# Patient Record
Sex: Male | Born: 1990 | Race: White | Hispanic: No | Marital: Single | Smoking: Never smoker
Health system: Southern US, Community
[De-identification: ages and names within clinical notes are randomized; demographics above are authoritative.]

## PROBLEM LIST (undated history)

## (undated) HISTORY — PX: ABDOMINAL SURGERY: SHX537

---
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
## 2015-01-30 ENCOUNTER — Encounter: Payer: Self-pay | Admitting: Emergency Medicine

## 2015-01-30 ENCOUNTER — Ambulatory Visit: Admission: EM | Admit: 2015-01-30 | Discharge: 2015-01-30 | Discharge status: Absent without leave | Payer: Self-pay

## 2015-01-30 ENCOUNTER — Emergency Department
Admission: EM | Admit: 2015-01-30 | Discharge: 2015-01-30 | Disposition: A | Discharge status: Home / Self Care | Payer: Managed Care, Other (non HMO) | Attending: Emergency Medicine | Admitting: Emergency Medicine

## 2015-01-30 ENCOUNTER — Ambulatory Visit
Admission: EM | Admit: 2015-01-30 | Discharge: 2015-01-30 | Disposition: A | Discharge status: Home / Self Care | Payer: Managed Care, Other (non HMO) | Attending: Family Medicine | Admitting: Family Medicine

## 2015-01-30 ENCOUNTER — Emergency Department: Payer: Managed Care, Other (non HMO)

## 2015-01-30 DIAGNOSIS — R519 Headache, unspecified: Secondary | ICD-10-CM

## 2015-01-30 DIAGNOSIS — R0789 Other chest pain: Secondary | ICD-10-CM | POA: Diagnosis not present

## 2015-01-30 DIAGNOSIS — Z79899 Other long term (current) drug therapy: Secondary | ICD-10-CM | POA: Insufficient documentation

## 2015-01-30 DIAGNOSIS — R0602 Shortness of breath: Secondary | ICD-10-CM | POA: Insufficient documentation

## 2015-01-30 DIAGNOSIS — R51 Headache: Secondary | ICD-10-CM | POA: Diagnosis present

## 2015-01-30 DIAGNOSIS — R42 Dizziness and giddiness: Secondary | ICD-10-CM | POA: Insufficient documentation

## 2015-01-30 DIAGNOSIS — R002 Palpitations: Secondary | ICD-10-CM | POA: Insufficient documentation

## 2015-01-30 DIAGNOSIS — J069 Acute upper respiratory infection, unspecified: Secondary | ICD-10-CM | POA: Diagnosis not present

## 2015-01-30 LAB — COMPREHENSIVE METABOLIC PANEL
ALBUMIN: 4.8 g/dL (ref 3.5–5.0)
ALK PHOS: 57 U/L (ref 38–126)
ALK PHOS: 58 U/L (ref 38–126)
ALT: 43 U/L (ref 17–63)
ALT: 44 U/L (ref 17–63)
ANION GAP: 12 (ref 5–15)
ANION GAP: 10 (ref 5–15)
AST: 31 U/L (ref 15–41)
AST: 30 U/L (ref 15–41)
Albumin: 5 g/dL (ref 3.5–5.0)
BILIRUBIN TOTAL: 1.4 mg/dL — AB (ref 0.3–1.2)
BUN: 13 mg/dL (ref 6–20)
BUN: 13 mg/dL (ref 6–20)
CALCIUM: 9.5 mg/dL (ref 8.9–10.3)
CALCIUM: 9.3 mg/dL (ref 8.9–10.3)
CHLORIDE: 101 mmol/L (ref 101–111)
CO2: 27 mmol/L (ref 22–32)
CO2: 24 mmol/L (ref 22–32)
CREATININE: 0.96 mg/dL (ref 0.61–1.24)
CREATININE: 0.87 mg/dL (ref 0.61–1.24)
Chloride: 104 mmol/L (ref 101–111)
GFR calc non Af Amer: >60 mL/min (ref >60)
GLUCOSE: 94 mg/dL (ref 65–99)
Glucose, Bld: 101 mg/dL — ABNORMAL HIGH (ref 65–99)
Potassium: 4.3 mmol/L (ref 3.5–5.1)
Potassium: 4.2 mmol/L (ref 3.5–5.1)
SODIUM: 140 mmol/L (ref 135–145)
Sodium: 138 mmol/L (ref 135–145)
TOTAL PROTEIN: 9.1 g/dL — AB (ref 6.5–8.1)
Total Bilirubin: 1.2 mg/dL (ref 0.3–1.2)
Total Protein: 8.8 g/dL — ABNORMAL HIGH (ref 6.5–8.1)

## 2015-01-30 LAB — CBC WITH DIFFERENTIAL/PLATELET
BASOS PCT: 0 %
Basophils Absolute: 0.1 10*3/uL (ref 0–0.1)
Basophils Absolute: 0 10*3/uL (ref 0–0.1)
Basophils Relative: 0 %
EOS ABS: 0.1 10*3/uL (ref 0–0.7)
EOS PCT: 0 %
Eosinophils Absolute: 0 10*3/uL (ref 0–0.7)
Eosinophils Relative: 0 %
HCT: 44.7 % (ref 40.0–52.0)
HEMATOCRIT: 45.9 % (ref 40.0–52.0)
HEMOGLOBIN: 15.3 g/dL (ref 13.0–18.0)
HEMOGLOBIN: 15.3 g/dL (ref 13.0–18.0)
Lymphocytes Relative: 5 %
Lymphocytes Relative: 6 %
Lymphs Abs: 0.9 10*3/uL — ABNORMAL LOW (ref 1.0–3.6)
Lymphs Abs: 0.9 10*3/uL — ABNORMAL LOW (ref 1.0–3.6)
MCH: 29.7 pg (ref 26.0–34.0)
MCH: 29.3 pg (ref 26.0–34.0)
MCHC: 34.1 g/dL (ref 32.0–36.0)
MCHC: 33.4 g/dL (ref 32.0–36.0)
MCV: 86.9 fL (ref 80.0–100.0)
MCV: 87.7 fL (ref 80.0–100.0)
MONOS PCT: 5 %
Monocytes Absolute: 0.9 10*3/uL (ref 0.2–1.0)
Monocytes Absolute: 0.8 10*3/uL (ref 0.2–1.0)
Monocytes Relative: 5 %
NEUTROS ABS: 14 10*3/uL — AB (ref 1.4–6.5)
NEUTROS PCT: 90 %
NEUTROS PCT: 89 %
Neutro Abs: 16 10*3/uL — ABNORMAL HIGH (ref 1.4–6.5)
PLATELETS: 227 10*3/uL (ref 150–440)
Platelets: 237 10*3/uL (ref 150–440)
RBC: 5.14 MIL/uL (ref 4.40–5.90)
RBC: 5.24 MIL/uL (ref 4.40–5.90)
RDW: 13.3 % (ref 11.5–14.5)
RDW: 13.3 % (ref 11.5–14.5)
WBC: 17.9 10*3/uL — AB (ref 3.8–10.6)
WBC: 15.7 10*3/uL — AB (ref 3.8–10.6)

## 2015-01-30 LAB — CKMB (ARMC ONLY): CK, MB: 1.3 ng/mL (ref 0.5–5.0)

## 2015-01-30 LAB — TROPONIN I

## 2015-01-30 LAB — FIBRIN DERIVATIVES D-DIMER (ARMC ONLY): FIBRIN DERIVATIVES D-DIMER (ARMC): 857 — AB (ref 0–499)

## 2015-01-30 LAB — CK: Total CK: 111 U/L (ref 49–397)

## 2015-01-30 MED ORDER — LEVOFLOXACIN 750 MG PO TABS: 750.0000 mg | ORAL_TABLET | Freq: Every day | ORAL | Status: AC

## 2015-01-30 MED ORDER — IOHEXOL 350 MG/ML SOLN
100.0000 mL | Freq: Once | INTRAVENOUS | Status: AC | PRN
  Administered 2015-01-30: 100 mL via INTRAVENOUS
  Filled 2015-01-30: qty 100

## 2015-01-30 MED ORDER — SUMATRIPTAN SUCCINATE 6 MG/0.5ML ~~LOC~~ SOLN
6.0000 mg | Freq: Once | SUBCUTANEOUS | Status: AC
  Administered 2015-01-30: 6 mg via SUBCUTANEOUS

## 2015-01-30 MED ORDER — HYDROCOD POLST-CPM POLST ER 10-8 MG/5ML PO SUER: 5.0000 mL | Freq: Two times a day (BID) | ORAL | Status: AC

## 2015-01-30 MED ORDER — KETOROLAC TROMETHAMINE 60 MG/2ML IM SOLN
60.0000 mg | Freq: Once | INTRAMUSCULAR | Status: AC
  Administered 2015-01-30: 60 mg via INTRAMUSCULAR

## 2015-01-30 MED ORDER — ONDANSETRON 8 MG PO TBDP
8.0000 mg | ORAL_TABLET | Freq: Once | ORAL | Status: AC
  Administered 2015-01-30: 8 mg via ORAL

## 2015-01-30 NOTE — ED Notes (Signed)
Patient states he has been up since 3am with a pounding headache, also feels like he cant take a deep breath and his pulse is racing

## 2015-01-30 NOTE — ED Notes (Signed)
Patient presents to the ED with headache, dizziness, and high heart rate from [facility].  Patient had labs drawn and [facility]KG done at [facility].  Patient also reports diaphoresis.  Patient reports waking up three diffierent days in the past week with a severe headache.  Patient reports a "pounding" headache that is worse with bright lights and movement.  Patient states headache has improved somewhat since going to [facility].  Patient states his watch recorded a pulse this am of 140 at 9am.  Patient denies chest pain at this time.  Patient reports difficulty taking a deep breath this am.  Patient states now he can breathe easier.

## 2015-01-30 NOTE — Discharge Instructions (Signed)
Upper Respiratory Infection, Adult Most upper respiratory infections (URIs) are caused by a virus. A URI affects the nose, throat, and upper air passages. The most common type of URI is often called "the common cold." HOME CARE   Take medicines only as told by your doctor.  Gargle warm saltwater or take cough drops to comfort your throat as told by your doctor.  Use a warm mist humidifier or inhale steam from a shower to increase air moisture. This may make it easier to breathe.  Drink enough fluid to keep your pee (urine) clear or pale yellow.  Eat soups and other clear broths.  Have a healthy diet.  Rest as needed.  Go back to work when your fever is gone or your doctor says it is okay.  You may need to stay home longer to avoid giving your URI to others.  You can also wear a face mask and wash your hands often to prevent spread of the virus.  Use your inhaler more if you have asthma.  Do not use any tobacco products, including cigarettes, chewing tobacco, or electronic cigarettes. If you need help quitting, ask your doctor. GET HELP IF:  You are getting worse, not better.  Your symptoms are not helped by medicine.  You have chills.  You are getting more short of breath.  You have brown or red mucus.  You have yellow or brown discharge from your nose.  You have pain in your face, especially when you bend forward.  You have a fever.  You have puffy (swollen) neck glands.  You have pain while swallowing.  You have white areas in the back of your throat. GET HELP RIGHT AWAY IF:   You have very bad or constant:  Headache.  Ear pain.  Pain in your forehead, behind your eyes, and over your cheekbones (sinus pain).  Chest pain.  You have long-lasting (chronic) lung disease and any of the following:  Wheezing.  Long-lasting cough.  Coughing up blood.  A change in your usual mucus.  You have a stiff neck.  You have changes in  your:  Vision.  Hearing.  Thinking.  Mood. MAKE SURE YOU:   Understand these instructions.  Will watch your condition.  Will get help right away if you are not doing well or get worse.   This information is not intended to replace advice given to you by your health care provider. Make sure you discuss any questions you have with your health care provider.   Document Released: 08/12/2007 Document Revised: 07/10/2014 Document Reviewed: 05/31/2013 Elsevier Interactive Patient Education 2016 Else[employer] Inc.  General Headache Without Cause A headache is pain or discomfort felt around the head or neck area. The specific cause of a headache may not be found. There are many causes and types of headaches. A few common ones are:  Tension headaches.  Migraine headaches.  Cluster headaches.  Chronic daily headaches. HOME CARE INSTRUCTIONS  Watch your condition for any changes. Take these steps to help with your condition: Managing Pain  Take over-the-counter and prescription medicines only as told by your health care provider.  Lie down in a dark, quiet room when you have a headache.  If directed, apply ice to the head and neck area:  Put ice in a plastic bag.  Place a towel between your skin and the bag.  Leave the ice on for 20 minutes, 2-3 times per day.  Use a heating pad or hot shower to apply heat  to the head and neck area as told by your health care provider.  Keep lights dim if bright lights bother you or make your headaches worse. Eating and Drinking  Eat meals on a regular schedule.  Limit alcohol use.  Decrease the amount of caffeine you drink, or stop drinking caffeine. General Instructions  Keep all follow-up visits as told by your health care provider. This is important.  Keep a headache journal to help find out what may trigger your headaches. For example, write down:  What you eat and drink.  How much sleep you get.  Any change to your diet or  medicines.  Try massage or other relaxation techniques.  Limit stress.  Sit up straight, and do not tense your muscles.  Do not use tobacco products, including cigarettes, chewing tobacco, or e-cigarettes. If you need help quitting, ask your health care provider.  Exercise regularly as told by your health care provider.  Sleep on a regular schedule. Get 7-9 hours of sleep, or the amount recommended by your health care provider. SEEK MEDICAL CARE IF:   Your symptoms are not helped by medicine.  You have a headache that is different from the usual headache.  You have nausea or you vomit.  You have a fever. SEEK IMMEDIATE MEDICAL CARE IF:   Your headache becomes severe.  You have repeated vomiting.  You have a stiff neck.  You have a loss of vision.  You have problems with speech.  You have pain in the eye or ear.  You have muscular weakness or loss of muscle control.  You lose your balance or have trouble walking.  You feel faint or pass out.  You have confusion.   This information is not intended to replace advice given to you by your health care provider. Make sure you discuss any questions you have with your health care provider.   Document Released: 02/23/2005 Document Revised: 11/14/2014 Document Reviewed: 06/18/2014 Elsevier Interactive Patient Education [employer]

## 2015-01-30 NOTE — Discharge Instructions (Signed)
Chest Wall Pain °Chest wall pain is pain in or around the bones and muscles of your chest. Sometimes, an injury causes this pain. Sometimes, the cause may not be known. This pain may take several weeks or longer to get better. °HOME CARE °Pay attention to any changes in your symptoms. Take these actions to help with your pain: °· Rest as told by your doctor. °· Avoid activities that cause pain. Try not to use your chest, belly (abdominal), or side muscles to lift heavy things. °· If directed, apply ice to the painful area: °¨ Put ice in a plastic bag. °¨ Place a towel between your skin and the bag. °¨ Leave the ice on for 20 minutes, 2-3 times per day. °· Take over-the-counter and prescription medicines only as told by your doctor. °· Do not use tobacco products, including cigarettes, chewing tobacco, and e-cigarettes. If you need help quitting, ask your doctor. °· Keep all follow-up visits as told by your doctor. This is important. °GET HELP IF: °· You have a fever. °· Your chest pain gets worse. °· You have new symptoms. °GET HELP RIGHT AWAY IF: °· You feel sick to your stomach (nauseous) or you throw up (vomit). °· You feel sweaty or light-headed. °· You have a cough with phlegm (sputum) or you cough up blood. °· You are short of breath. °  °This information is not intended to replace advice given to you by your health care provider. Make sure you discuss any questions you have with your health care provider. °  °Document Released: 08/12/2007 Document Revised: 11/14/2014 Document Reviewed: 05/21/2014 °Elsevier Interactive Patient Education ©2016 Elsevier Inc. ° °

## 2015-01-30 NOTE — ED Notes (Signed)
Dr. Thurmond ButtsWade spoke with Jaime SoursGreg RN at [facility] to inform him patient going POV there. Labs drawn and all ordered medications given. States 6/10 headache still, no relief from Toradol or Imitrex

## 2015-01-30 NOTE — ED Provider Notes (Signed)
[facility] Emergency Department Provider Note     Time seen: ----------------------------------------- 2:08 PM on 01/30/2015 -----------------------------------------    I have reviewed the triage vital signs and the nursing notes.   HISTORY  Chief Complaint Headache and Palpitations    HPI Jaime Lewis is a 24 y.o. male who presents ER with severe headache on and off last week. Patient is typically not had a history of headaches, presented to an urgent care today with headache dizziness and rapid heartbeat. Patient had labs drawn EKG done at [facility]Patient reports a pounding headache gets the top of his head was worse with bright lights and movement. Patient received Toradol shot at [facility] urgent care and his symptoms have currently resolved   No past medical history on file.  There are no active problems to display for this patient.   Past Surgical History  Procedure Laterality Date  . Abdominal surgery    . 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 Smoking status: Never Smoker   . Smokeless tobacco: None  . Alcohol Use: Yes     Comment: occasionally    Review of Systems Constitutional: Negative for fever. Eyes: Negative for visual changes. ENT: Negative for sore throat. Cardiovascular: Negative for chest pain. Respiratory: Negative for shortness of breath. Gastrointestinal: Negative for abdominal pain, vomiting and diarrhea. Genitourinary: Negative for dysuria. Musculoskeletal: Negative for back pain. Skin: Negative for rash. Neurological: Positive for headache and dizziness  10-point ROS otherwise negative.  ____________________________________________   PHYSICAL EXAM:  VITAL SIGNS: ED Triage Vitals  Enc Vitals Group     BP 01/30/15 1252 136/79 mmHg     Pulse Rate 01/30/15 1252 103     Resp  01/30/15 1252 20     Temp 01/30/15 1252 98.5 F (36.9 C)     Temp Source 01/30/15 1252 Oral     SpO2 01/30/15 1252 96 %     Weight 01/30/15 1252 350 lb (158.759 kg)     Height 01/30/15 1252  (1.854 m)     Head Cir --      Peak Flow --      Pain Score 01/30/15 1253 4     Pain Loc --      Pain Edu? --      Excl. in GC? --     Constitutional: Alert and oriented. Well appearing and in no distress. Eyes: Conjunctivae are normal. PERRL. Normal extraocular movements. ENT   Head: Normocephalic and atraumatic.   Nose: No congestion/rhinnorhea.   Mouth/Throat: Mucous membranes are moist.   Neck: No stridor. Cardiovascular: Normal rate, regular rhythm. Normal and symmetric distal pulses are present in all extremities. No murmurs, rubs, or gallops. Respiratory: Normal respiratory effort without tachypnea nor retractions. Breath sounds are clear and equal bilaterally. No wheezes/rales/rhonchi. Gastrointestinal: Soft and nontender. No distention. No abdominal bruits.  Musculoskeletal: Nontender with normal range of motion in all extremities. No joint effusions.  No lower extremity tenderness nor edema. Neurologic:  Normal speech and language. No gross focal neurologic deficits are appreciated. Speech is normal. No gait instability. Skin:  Skin is warm, dry and intact. No rash noted. Psychiatric: Mood and affect are normal. Speech and behavior are normal. Patient exhibits appropriate insight and judgment. ____________________________________________  ED COURSE:  Pertinent labs & imaging results that were available during my care of the  patient were reviewed by me and considered in my medical decision making (see chart for details). Patient is in no acute distress, unclear etiology. I will check basic labs and obtain his CT imaging ____________________________________________    LABS (pertinent positives/negatives)  Labs Reviewed  CBC WITH DIFFERENTIAL/PLATELET - Abnormal;  Notable for the following:    WBC 15.7 (*)    Neutro Abs 14.0 (*)    Lymphs Abs 0.9 (*)    All other components within normal limits  COMPREHENSIVE METABOLIC PANEL - Abnormal; Notable for the following:    Total Protein 9.1 (*)    Total Bilirubin 1.4 (*)    All other components within normal limits    RADIOLOGY Images were viewed by me  CT head Is unremarkable Ct chest  IMPRESSION: 1. No evidence of acute pulmonary embolism. 2. Patchy ground-glass opacities in both lungs. These are nonspecific and may be secondary to edema, early inflammation or interstitial lung disease. Radiographic follow up recommended. 3. Mild hepatic steatosis. ____________________________________________  FINAL ASSESSMENT AND PLAN  Headache, palpitations  Plan: Patient with labs and imaging as dictated above. Patient with elevated d-dimer sent by urgent care, CT angiogram will be performed.   Patient could be developing a respiratory infection. I cannot explain all the symptoms or lab findings. He'll be given Levaquin and Tussionex which would cover any cough or congestion as well as headache. He is encouraged to follow-up with his doctor in 2 days for recheck.   Emily FilbertWilliams, Malaquias Lenker E, MD   Emily FilbertJonathan E Jahne Krukowski, MD 01/30/15 [id number]

## 2015-01-30 NOTE — ED Provider Notes (Signed)
CSN: [id number]     Arrival date & time 01/30/15  1001 History   First MD Initiated Contact with Patient 01/30/15 1113    Nurses notes were reviewed. Chief Complaint  Patient presents with  . Headache     Patient is an obese white male with history of having severe headaches today. He states that last week he had real bad headache lasted for a short time he also became somewhat diaphoretic and short of breath as well. Monday he had a recurrence but did not last long and then occluded. Today he was at work starts sweating in a cold environment became short of breath and chest discomfort chest pain and headache. He does not have a history of migraines. Reports no long travels in the last few weeks. No history of blood clots. Other than also obesity which she states she's lost about 30 pounds in the last few months by changes eating habits not by excising he denies any other medical issues. States is not diaphoretic anymore but the headache has continued to feel like a pounding sensation. He's never had a CT of the head either.  (Consider location/radiation/quality/duration/timing/severity/associated sxs/prior Treatment) Patient is a 24 y.o. male presenting with headaches. The history is provided by the patient. A language interpreter was used.  Headache Pain location:  Generalized Quality:  Stabbing Radiates to:  Does not radiate Onset quality:  Sudden Timing:  Unable to specify Progression:  Unchanged Context: activity and bright light   Relieved by:  Nothing Worsened by:  Activity and light Ineffective treatments:  None tried Associated symptoms: no facial pain, no hearing loss and no numbness   Risk factors: sedentary lifestyle   Risk factors: no anger, no family hx of SAH and does not have insomnia     History reviewed. No pertinent past medical history. Past Surgical History  Procedure Laterality Date  . Abdominal surgery     History reviewed. No pertinent family history. Social  History  Substance Use Topics  . Smoking status: Never Smoker   . Smokeless tobacco: None  . Alcohol Use: Yes    Review of Systems  Constitutional: Positive for diaphoresis.  HENT: Negative for hearing loss.   Respiratory: Positive for chest tightness and shortness of breath. Negative for wheezing.   Neurological: Positive for light-headedness and headaches. Negative for speech difficulty and numbness.  Psychiatric/Behavioral: The patient is nervous/anxious.   All other systems reviewed and are negative.   Allergies  Sulfa antibiotics  Home Medications   Prior to Admission medications   Not on File   Meds Ordered and Administered this Visit   Medications  ondansetron (ZOFRAN-ODT) disintegrating tablet 8 mg (8 mg Oral Given 01/30/15 1214)  SUMAtriptan (IMITREX) injection 6 mg (6 mg Subcutaneous Given 01/30/15 1203)  ketorolac (TORADOL) injection 60 mg (60 mg Intramuscular Given 01/30/15 1203)    BP 135/75 mmHg  Pulse 109  Temp(Src) 98.7 F (37.1 C) (Tympanic)  Resp 20  Ht 6\' 1"  (1.854 m)  Wt 357 lb (161.934 kg)  BMI 47.11 kg/m2  SpO2 98% No data found.   Physical Exam  Constitutional: He is oriented to person, place, and time. He appears well-developed and well-nourished. He appears distressed.  Obese white male  HENT:  Head: Normocephalic and atraumatic.  Right Ear: External ear normal.  Left Ear: External ear normal.  Eyes: Conjunctivae are normal. Pupils are equal, round, and reactive to light.  Neck: Normal range of motion. Neck supple. No tracheal deviation present.  Cardiovascular:  Normal rate, regular rhythm and normal heart sounds.   Pulmonary/Chest: Effort normal and breath sounds normal.  Musculoskeletal: Normal range of motion. He exhibits no edema.  Neurological: He is alert and oriented to person, place, and time. No cranial nerve deficit. Coordination normal.  Skin: Skin is warm and dry. No erythema.  Psychiatric: He has a normal mood and affect.   Vitals reviewed.   ED Course  Procedures (including critical care time)  Labs Review Labs Reviewed  CBC WITH DIFFERENTIAL/PLATELET - Abnormal; Notable for the following:    WBC 17.9 (*)    Neutro Abs 16.0 (*)    Lymphs Abs 0.9 (*)    All other components within normal limits  BASIC METABOLIC PANEL  FIBRIN DERIVATIVES D-DIMER ([facility] ONLY)  TROPONIN I  CK  CKMB([facility] ONLY)  COMPREHENSIVE METABOLIC PANEL    Imaging Review No results found.   Visual Acuity Review  Right Eye Distance:   Left Eye Distance:   Bilateral Distance:    Right Eye Near:   Left Eye Near:    Bilateral Near:     ED ECG REPORT   Date: 01/30/2015  EKG Time: 12:29 PM  Rate: 94  Rhythm: normal sinus rhythm,  normal EKG, normal sinus rhythm, there are no previous tracings available for comparison  Axis: 45  Intervals:none  ST&T Change: none  Narrative Interpretation: sinus rhythm w/sinus arrhytmia      Results for orders placed or performed during the hospital encounter of 01/30/15  CBC with Differential  Result Value Ref Range   WBC 17.9 (H) 3.8 - 10.6 K/uL   RBC 5.14 4.40 - 5.90 MIL/uL   Hemoglobin 15.3 13.0 - 18.0 g/dL   HCT 91.4 78.2 - 95.6 %   MCV 86.9 80.0 - 100.0 fL   MCH 29.7 26.0 - 34.0 pg   MCHC 34.1 32.0 - 36.0 g/dL   RDW 21.3 08.6 - 57.8 %   Platelets 227 150 - 440 K/uL   Neutrophils Relative % 90 %   Neutro Abs 16.0 (H) 1.4 - 6.5 K/uL   Lymphocytes Relative 5 %   Lymphs Abs 0.9 (L) 1.0 - 3.6 K/uL   Monocytes Relative 5 %   Monocytes Absolute 0.9 0.2 - 1.0 K/uL   Eosinophils Relative 0 %   Eosinophils Absolute 0.1 0 - 0.7 K/uL   Basophils Relative 0 %   Basophils Absolute 0.1 0 - 0.1 K/uL  Fibrin derivatives D-Dimer ([facility] only)  Result Value Ref Range   Fibrin derivatives D-dimer ([facility]) 857 (H) 0 - 499  Troponin I  Result Value Ref Range   Troponin I <0.03 <0.031 ng/mL  CK  Result Value Ref Range   Total CK 111 49 - 397 U/L  CKMB([facility] only)  Result Value Ref  Range   CK, MB 1.3 0.5 - 5.0 ng/mL  Comprehensive metabolic panel  Result Value Ref Range   Sodium 140 135 - 145 mmol/L   Potassium 4.3 3.5 - 5.1 mmol/L   Chloride 101 101 - 111 mmol/L   CO2 27 22 - 32 mmol/L   Glucose, Bld 101 (H) 65 - 99 mg/dL   BUN 13 6 - 20 mg/dL   Creatinine, Ser 4.69 0.61 - 1.24 mg/dL   Calcium 9.5 8.9 - 62.9 mg/dL   Total Protein 8.8 (H) 6.5 - 8.1 g/dL   Albumin 5.0 3.5 - 5.0 g/dL   AST 31 15 - 41 U/L   ALT 43 17 - 63 U/L   Alkaline  Phosphatase 57 38 - 126 U/L   Total Bilirubin 1.2 0.3 - 1.2 mg/dL   GFR calc non Af Amer >60 >60 mL/min   GFR calc Af Amer >60 >60 mL/min   Anion gap 12 5 - 15          MDM   1. Nonintractable headache, unspecified chronicity pattern, unspecified headache type   2. SOB (shortness of breath)   3. Chest discomfort       Concern about the headache and the shortness of breath and chest discomfort. EKG was obtained and basically a normal EKG with a little bit of sinus arrhythmia but no SS waves in 1 or Q waves in 3. Because of his age will do further evaluation here persisting to the ED explained to him that is a very short lesions or tolerance to send him to the emergency room for further evaluation. Will get d-dimer and troponin and will administer Toradol and Imitrex to see if it helps the headache. May need to get outpatient CT of the head as well.    Initially was going to do some workup here before sending him to the ED however pulse ox was 95% and with evidence of of him having shortness of breath will send him to [facility] ED discussed with charge nurse Tammy Sours.     Hassan Rowan, MD 01/30/15 1414

## 2019-10-06 ENCOUNTER — Ambulatory Visit: Payer: Managed Care, Other (non HMO) | Attending: Internal Medicine

## 2019-10-06 DIAGNOSIS — Z23 Encounter for immunization: Secondary | ICD-10-CM

## 2019-10-06 NOTE — Progress Notes (Signed)
   Covid-19 Vaccination Clinic  Name:  Jaime Lewis    MRN: [id number] DOB: 1990-09-01  10/06/2019  [name] was observed post Covid-19 immunization for 15 minutes without incident. He was provided with Vaccine Information Sheet and instruction to access the V-Safe system.   [name] was instructed to call 911 with any severe reactions post vaccine: Marland Kitchen Difficulty breathing  . Swelling of face and throat  . A fast heartbeat  . A bad rash all over body  . Dizziness and weakness   Immunizations Administered    Name Date Dose VIS Date Route   Pfizer COVID-19 Vaccine 10/06/2019  8:34 AM 0.3 mL 05/03/2018 Intramuscular   Manufacturer: [employer], [employer]   Lot: [id number]   NDC: [address]-3

## 2019-10-30 ENCOUNTER — Ambulatory Visit: Payer: Managed Care, Other (non HMO)

## 2019-10-30 ENCOUNTER — Ambulatory Visit: Payer: Managed Care, Other (non HMO) | Attending: Internal Medicine

## 2019-10-30 DIAGNOSIS — Z23 Encounter for immunization: Secondary | ICD-10-CM

## 2019-10-30 NOTE — Progress Notes (Signed)
   Covid-19 Vaccination Clinic  Name:  Jaime Lewis    MRN: [id number] DOB: 01/17/1991  10/30/2019  Mr. Jaime Lewis was observed post Covid-19 immunization for 15 minutes without incident. He was provided with Vaccine Information Sheet and instruction to access the V-Safe system.   Mr. Jaime Lewis was instructed to call 911 with any severe reactions post vaccine: Marland Kitchen Difficulty breathing  . Swelling of face and throat  . A fast heartbeat  . A bad rash all over body  . Dizziness and weakness   Immunizations Administered    Name Date Dose VIS Date Route   Pfizer COVID-19 Vaccine 10/30/2019  2:26 PM 0.3 mL 05/03/2018 Intramuscular   Manufacturer: [employer], [employer]   Lot: [id number]   NDC: [address]-5

## 2021-01-02 ENCOUNTER — Ambulatory Visit: Payer: Managed Care, Other (non HMO)

## 2021-01-02 ENCOUNTER — Other Ambulatory Visit: Payer: Self-pay

## 2021-01-02 ENCOUNTER — Ambulatory Visit
Admission: RE | Admit: 2021-01-02 | Discharge: 2021-01-02 | Disposition: A | Discharge status: Home / Self Care | Payer: 59 | Source: Ambulatory Visit | Attending: Family Medicine | Admitting: Family Medicine

## 2021-01-02 ENCOUNTER — Other Ambulatory Visit: Payer: Self-pay | Admitting: Family Medicine

## 2021-01-02 DIAGNOSIS — R2241 Localized swelling, mass and lump, right lower limb: Secondary | ICD-10-CM | POA: Diagnosis present

## 2021-01-02 DIAGNOSIS — M79671 Pain in right foot: Secondary | ICD-10-CM | POA: Diagnosis present

## 2021-03-19 ENCOUNTER — Ambulatory Visit
Admission: EM | Admit: 2021-03-19 | Discharge: 2021-03-19 | Disposition: A | Discharge status: Home / Self Care | Payer: BC Managed Care – PPO | Attending: Medical Oncology | Admitting: Medical Oncology

## 2021-03-19 ENCOUNTER — Encounter: Payer: Self-pay | Admitting: Emergency Medicine

## 2021-03-19 DIAGNOSIS — J0100 Acute maxillary sinusitis, unspecified: Secondary | ICD-10-CM | POA: Diagnosis not present

## 2021-03-19 MED ORDER — FLUTICASONE PROPIONATE 50 MCG/ACT NA SUSP: 2.0000 | Freq: Every day | NASAL | 0 refills | Status: AC

## 2021-03-19 MED ORDER — AMOXICILLIN-POT CLAVULANATE 875-125 MG PO TABS: 1.0000 | ORAL_TABLET | Freq: Two times a day (BID) | ORAL | 0 refills | Status: DC

## 2021-03-19 NOTE — ED Provider Notes (Signed)
Jaime Lewis    CSN: [id number] Arrival date & time: 03/19/21  0809      History   Chief Complaint Chief Complaint  Patient presents with   Nasal Congestion   Headache   Otalgia    HPI Jaime Lewis is a 31 y.o. male.   HPI  Cold Symptoms: Patient reports that they have had symptoms of nasal congestion, sinus congestion, facial pain, sinus headache, ear fullness for the past 6 days. Symptoms are stable to worsening. They deny SOB, chest pain, fever(but has a low grade on in office today) or vomiting. They have tried mucinex for symptoms. No known sick contacts.    History reviewed. No pertinent past medical history.  There are no problems to display for this patient.   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 Authorizing Provider  chlorpheniramine-HYDROcodone (TUSSIONEX PENNKINETIC ER) 10-8 MG/5ML SUER Take 5 mLs by mouth 2 (two) times daily. 01/30/15   Earleen Newport, MD  levofloxacin (LEVAQUIN) 750 MG tablet Take 1 tablet (750 mg total) by mouth daily. 01/30/15   Earleen Newport, MD    Family History History reviewed. No pertinent family history.  Social History Social History   Tobacco Use   Smoking status: Never  Substance Use Topics   Alcohol use: Yes    Comment: occasionally     Allergies   Sulfa antibiotics   Review of Systems Review of Systems  As stated above in HPI Physical Exam Triage Vital Signs ED Triage Vitals  Enc Vitals Group     BP 03/19/21 0823 (!) 168/91     Pulse Rate 03/19/21 0823 (!) 106     Resp 03/19/21 0823 20     Temp 03/19/21 0823 99.1 F (37.3 C)     Temp src --      SpO2 03/19/21 0823 98 %     Weight --      Height --      Head Circumference --      Peak Flow --      Pain Score  03/19/21 0825 4     Pain Loc --      Pain Edu? --      Excl. in [place]? --    No data found.  Updated Vital Signs BP (!) 168/91    Pulse (!) 106    Temp 99.1 F (37.3 C)    Resp 20    SpO2 98%   Physical Exam Vitals and nursing note reviewed.  Constitutional:      General: He is not in acute distress.    Appearance: He is well-developed.  HENT:     Head: Normocephalic and atraumatic.     Right Ear: Tympanic membrane normal.     Left Ear: Tympanic membrane normal.     Nose: Congestion (with Maxillary sinus bogginess and tenderness to palpation) and rhinorrhea present.     Mouth/Throat:     Mouth: Mucous membranes are dry.     Pharynx: Oropharynx is clear. No oropharyngeal exudate or posterior oropharyngeal erythema.  Eyes:     Conjunctiva/sclera: Conjunctivae normal.  Cardiovascular:     Rate and Rhythm: Normal rate and regular rhythm.     Heart sounds: No murmur  heard. Pulmonary:     Effort: Pulmonary effort is normal. No respiratory distress.     Breath sounds: Normal breath sounds.  Abdominal:     Palpations: Abdomen is soft.     Tenderness: There is no abdominal tenderness.  Musculoskeletal:        General: No swelling.     Cervical back: Normal range of motion and neck supple.  Lymphadenopathy:     Cervical: No cervical adenopathy.  Skin:    General: Skin is warm and dry.     Capillary Refill: Capillary refill takes less than 2 seconds.  Neurological:     Mental Status: He is alert.  Psychiatric:        Mood and Affect: Mood normal.     UC Treatments / Results  Labs (all labs ordered are listed, but only abnormal results are displayed) Labs Reviewed - No data to display  EKG   Radiology No results found.  Procedures Procedures (including critical care time)  Medications Ordered in UC Medications - No data to display  Initial Impression / Assessment and Plan / UC Course  I have reviewed the triage vital signs and the nursing notes.  Pertinent labs  & imaging results that were available during my care of the patient were reviewed by me and considered in my medical decision making (see chart for details).     New. Acute bacterial sinusitis. He will start sinus rinses, Augmentin and flonase. Hydration with water. Discussed. He will follow up with his ENT or our office as needed. He also reports new potential white coat syndrome and is monitoring BP closely at home with normal range being around 130/75- he will recheck his BP at home and alert [place] if range is above 140/80.  Final Clinical Impressions(s) / UC Diagnoses   Final diagnoses:  None   Discharge Instructions   None    ED Prescriptions   None    PDMP not reviewed this encounter.   Hughie Closs, [place] 03/19/21 [phone]

## 2021-03-19 NOTE — ED Triage Notes (Signed)
Pt here with nasal congestion, headache, facial pain, and ear fullness x 6 days. No fevers reported at home.

## 2023-01-14 ENCOUNTER — Encounter: Payer: Self-pay | Admitting: Emergency Medicine

## 2023-01-20 IMAGING — US US EXTREM LOW VENOUS*R*
1 series · 14 of 24 positions shown · non-contrast
Comparison: None.

CLINICAL DATA: Right foot pain and swelling

EXAM:
RIGHT LOWER EXTREMITY VENOUS DOPPLER ULTRASOUND
TECHNIQUE: Gray-scale sonography with compression, as well as color and duplex
ultrasound, were performed to evaluate the deep venous system(s)
from the level of the common femoral vein through the popliteal and
proximal calf veins.

[Series 1: us extrem low venous*right* · 0.08mm/px · 14 of 34 slices shown]
[im 1/34]
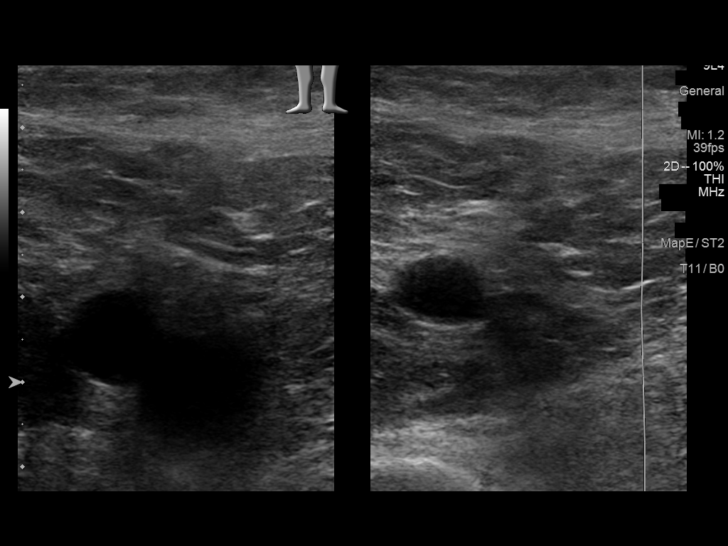
[im 3/34]
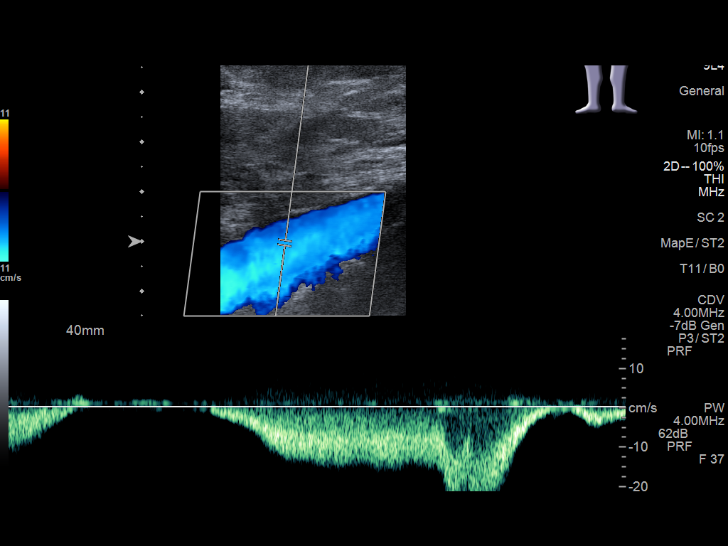
[im 6/34]
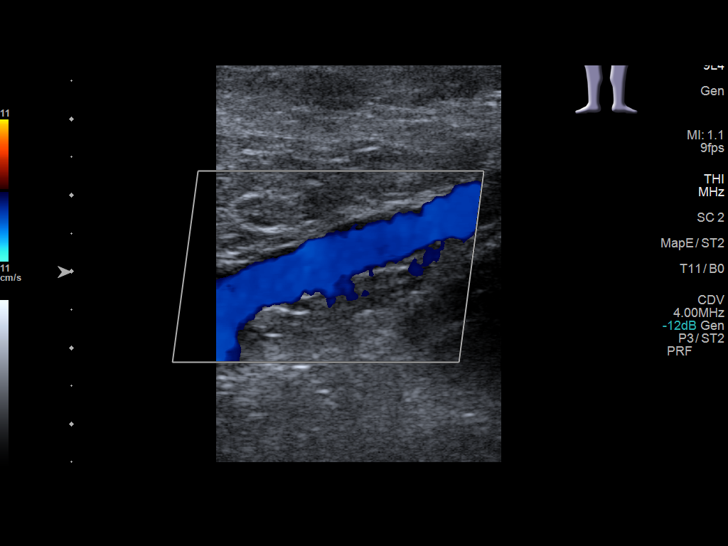
[im 9/34]
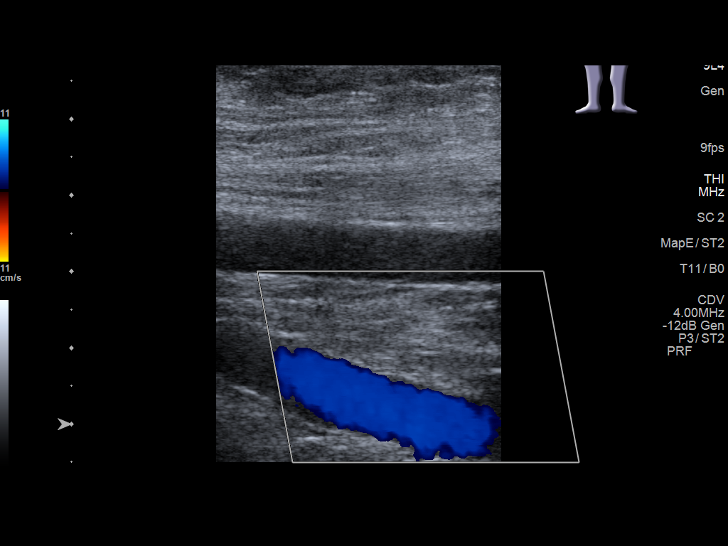
[im 11/34]
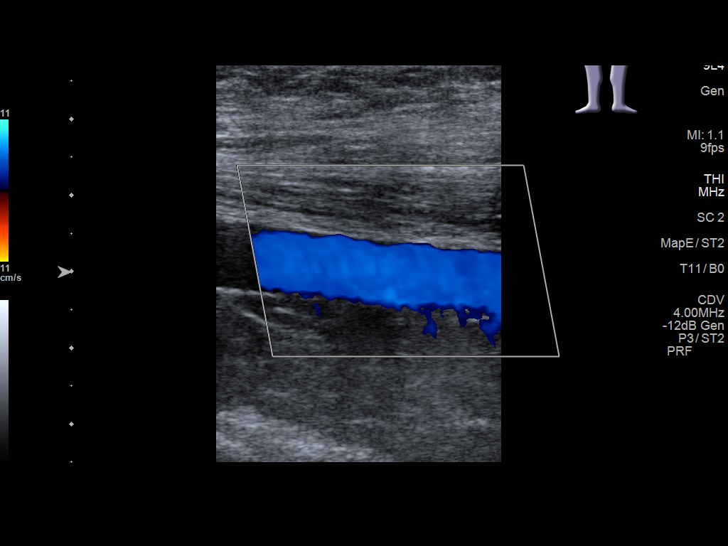
[im 13/34]
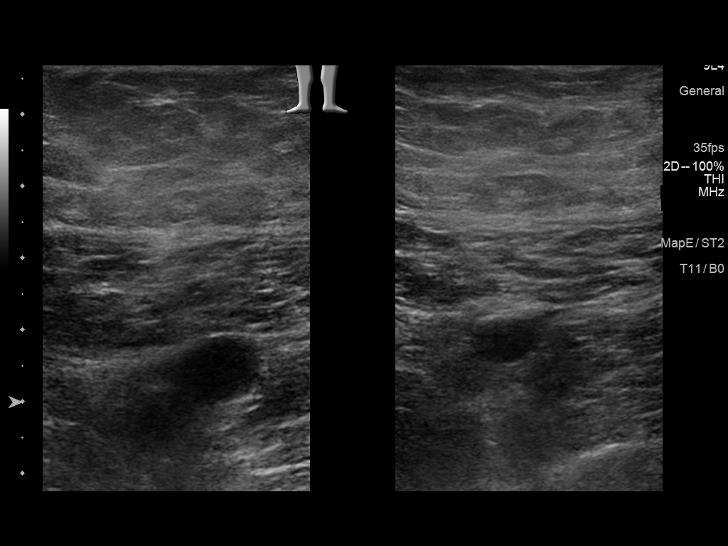
[im 16/34]
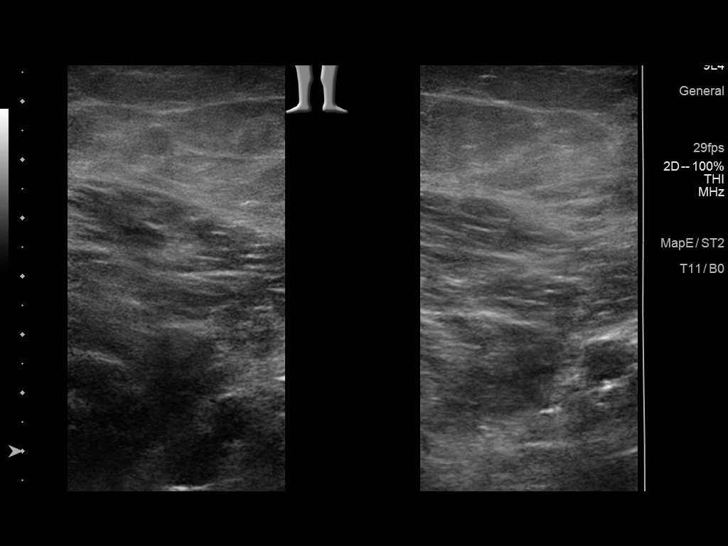
[im 18/34]
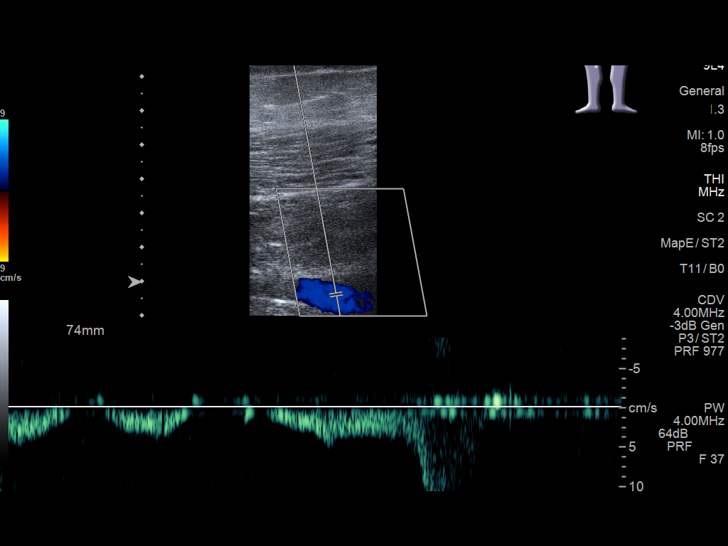
[im 21/34]
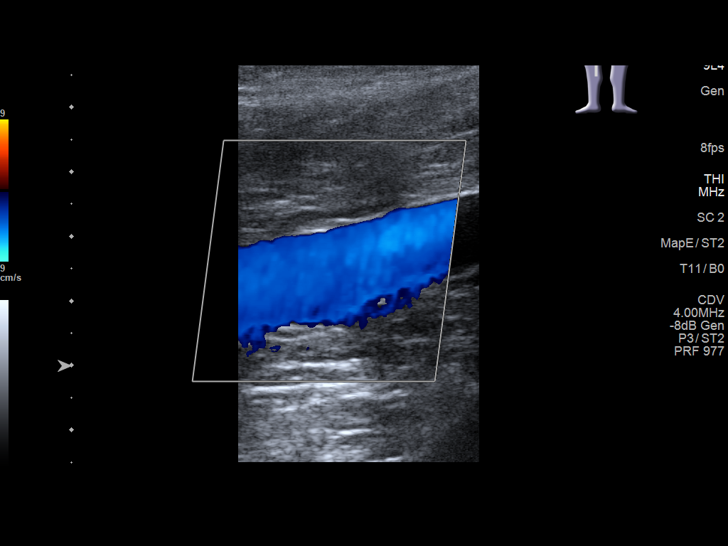
[im 23/34]
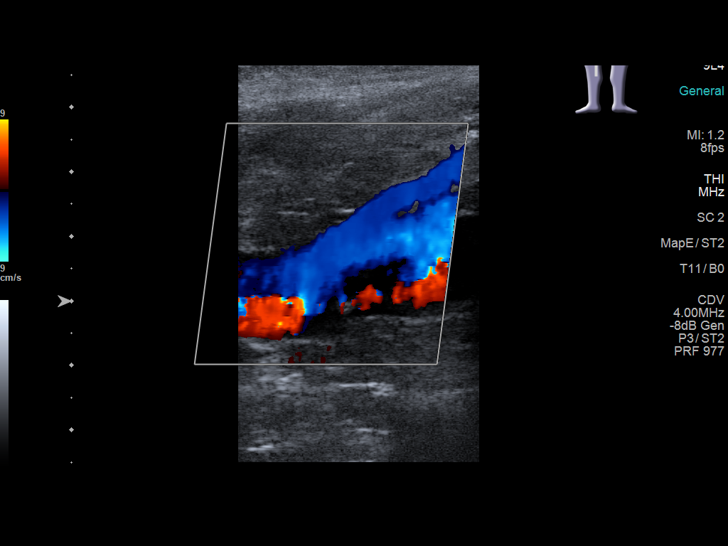
[im 26/34]
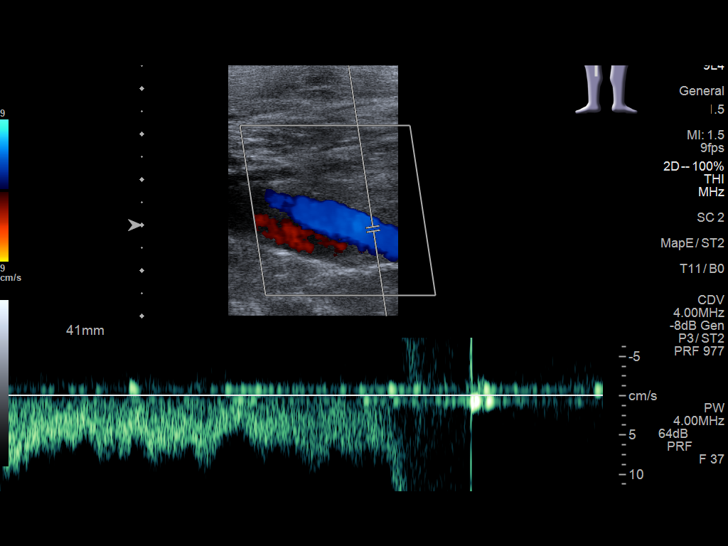
[im 28/34]
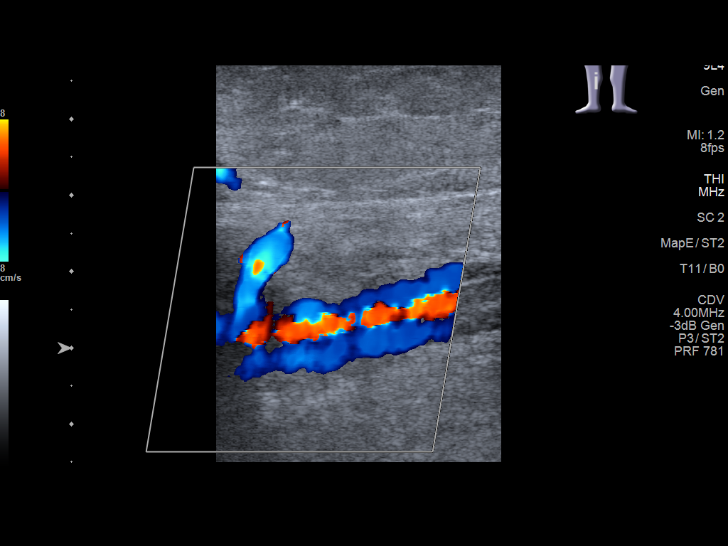
[im 31/34]
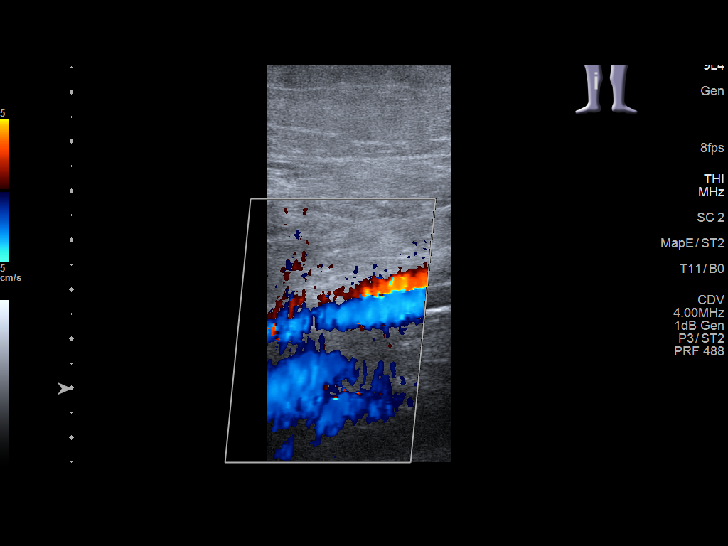
[im 34/34]
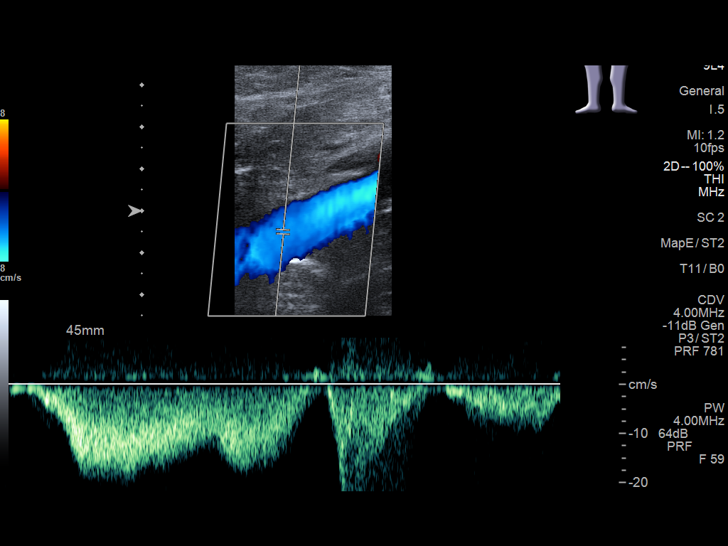

[14 of 24 positions shown; findings below may reference images not displayed]

FINDINGS: VENOUS

Normal compressibility of the common femoral, superficial femoral,
and popliteal veins, as well as the visualized calf veins.
Visualized portions of profunda femoral vein and great saphenous
vein unremarkable. No filling defects to suggest DVT on grayscale or
color Doppler imaging. Doppler waveforms show normal direction of
venous flow, normal respiratory plasticity and response to
augmentation.

Limited views of the contralateral common femoral vein are
unremarkable.

OTHER

None.

Limitations: none
IMPRESSION: Negative.

## 2023-04-02 ENCOUNTER — Ambulatory Visit
Admission: EM | Admit: 2023-04-02 | Discharge: 2023-04-02 | Disposition: A | Discharge status: Home / Self Care | Payer: 59

## 2023-04-02 DIAGNOSIS — A084 Viral intestinal infection, unspecified: Secondary | ICD-10-CM | POA: Diagnosis not present

## 2023-04-02 MED ORDER — ONDANSETRON 4 MG PO TBDP: 4.0000 mg | ORAL_TABLET | Freq: Three times a day (TID) | ORAL | 0 refills | Status: AC | PRN

## 2023-04-02 NOTE — Discharge Instructions (Addendum)
Take the antinausea medication as directed.    Keep yourself hydrated with clear liquids, such as water.  Follow the diarrhea diet as tolerated.   Go to the emergency department if you have worsening symptoms.    Follow up with your primary care provider.

## 2023-04-02 NOTE — ED Triage Notes (Signed)
Patient to Urgent Care with complaints of emesis/ diarrhea / fatigue/ poor sleep. Denies any known fevers.   Reports symptoms started Monday. Drinking Pedialyte and liquid IV.

## 2023-04-02 NOTE — ED Provider Notes (Signed)
Jaime Lewis    CSN: [id number] Arrival date & time: 04/02/23  4098      History   Chief Complaint Chief Complaint  Patient presents with   Emesis    HPI Jaime Lewis is a 33 y.o. male.  Patient presents on day 4 of nausea, vomiting, diarrhea, fatigue.  No emesis today; last episode yesterday at lunchtime.  2 episodes of diarrhea today.  No recent travel out of the country or antibiotic use.  Patient has been drinking Pedialyte, water, liquid IV.  No fever or abdominal pain.    The history is provided by the patient and medical records.    History reviewed. No pertinent past medical history.  There are no active problems to display for this patient.   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 Authorizing Provider  ondansetron (ZOFRAN-ODT) 4 MG disintegrating tablet Take 1 tablet (4 mg total) by mouth every 8 (eight) hours as needed for nausea or vomiting. 04/02/23  Yes Mickie Bail, NP  Semaglutide, 2 MG/DOSE, 8 MG/3ML SOPN Inject into the skin. 04/01/23 03/30/24 Yes [provider]  amoxicillin-clavulanate (AUGMENTIN) 875-125 MG tablet Take 1 tablet by mouth every 12 (twelve) hours. Patient not taking: Reported on 04/02/2023 03/19/21   Rushie Chestnut, PA-C  atorvastatin (LIPITOR) 20 MG tablet Take 20 mg by mouth daily.    [provider]  chlorpheniramine-HYDROcodone (TUSSIONEX PENNKINETIC ER) 10-8 MG/5ML SUER Take 5 mLs by mouth 2 (two) times daily. Patient not taking: Reported on 04/02/2023 01/30/15   Emily Filbert, MD  FLUoxetine (PROZAC) 10 MG capsule Take 10 mg by mouth daily.    [provider]  fluticasone (FLONASE) 50 MCG/ACT nasal spray Place 2 sprays into both nostrils daily. 03/19/21   Rushie Chestnut,  PA-C  hydrOXYzine (ATARAX) 25 MG tablet Take 25 mg by mouth 3 (three) times daily.    [provider]  levofloxacin (LEVAQUIN) 750 MG tablet Take 1 tablet (750 mg total) by mouth daily. Patient not taking: Reported on 04/02/2023 01/30/15   Emily Filbert, MD  lisinopril (ZESTRIL) 10 MG tablet Take 10 mg by mouth 2 (two) times daily.    [provider]  metFORMIN (GLUCOPHAGE) 500 MG tablet Take 500 mg by mouth 2 (two) times daily.    [provider]    Family History History reviewed. No pertinent family history.  Social History Social History   Tobacco Use   Smoking status: Never  Substance Use Topics   Alcohol use: Yes    Comment: occasionally     Allergies   Sulfa antibiotics   Review of Systems Review of Systems  Constitutional:  Positive for fatigue. Negative for chills and fever.  Gastrointestinal:  Positive for diarrhea, nausea and vomiting. Negative for abdominal pain.     Physical Exam Triage Vital Signs ED Triage Vitals  Encounter Vitals Group     BP 04/02/23 0856 136/86     Systolic BP Percentile --      Diastolic BP Percentile --      Pulse Rate 04/02/23 0851 96     Resp 04/02/23 0851 18     Temp 04/02/23 0851 98.4 F (36.9 C)  Temp src --      SpO2 04/02/23 0851 95 %     Weight --      Height --      Head Circumference --      Peak Flow --      Pain Score --      Pain Loc --      Pain Education --      Exclude from Growth Chart --    No data found.  Updated Vital Signs BP 136/86   Pulse 96   Temp 98.4 F (36.9 C)   Resp 18   SpO2 95%   Visual Acuity Right Eye Distance:   Left Eye Distance:   Bilateral Distance:    Right Eye Near:   Left Eye Near:    Bilateral Near:     Physical Exam Constitutional:      General: He is not in acute distress. HENT:     Right Ear: Tympanic membrane normal.     Left Ear: Tympanic membrane normal.     Nose: Nose normal.     Mouth/Throat:     Mouth: Mucous  membranes are moist.     Pharynx: Oropharynx is clear.  Cardiovascular:     Rate and Rhythm: Normal rate and regular rhythm.     Heart sounds: Normal heart sounds.  Pulmonary:     Effort: Pulmonary effort is normal. No respiratory distress.     Breath sounds: Normal breath sounds.  Abdominal:     General: Bowel sounds are normal.     Palpations: Abdomen is soft.     Tenderness: There is no abdominal tenderness. There is no guarding or rebound.  Neurological:     Mental Status: He is alert.      UC Treatments / Results  Labs (all labs ordered are listed, but only abnormal results are displayed) Labs Reviewed - No data to display  EKG   Radiology No results found.  Procedures Procedures (including critical care time)  Medications Ordered in UC Medications - No data to display  Initial Impression / Assessment and Plan / UC Course  I have reviewed the triage vital signs and the nursing notes.  Pertinent labs & imaging results that were available during my care of the patient were reviewed by me and considered in my medical decision making (see chart for details).    Viral gastroenteritis.  Treating nausea and vomiting with Zofran.  Discussed clear liquid diet.  Instructed patient to advance to diarrhea diet as tolerated.  Discussed maintaining oral hydration at home; ED precautions discussed.  Education provided on viral gastroenteritis.  Instructed patient to follow-up with his PCP as needed.  He agrees to plan of care.   Final Clinical Impressions(s) / UC Diagnoses   Final diagnoses:  Viral gastroenteritis     Discharge Instructions      Take the antinausea medication as directed.    Keep yourself hydrated with clear liquids, such as water.  Follow the diarrhea diet as tolerated.   Go to the emergency department if you have worsening symptoms.    Follow up with your primary care provider.          ED Prescriptions     Medication Sig Dispense Auth.  Provider   ondansetron (ZOFRAN-ODT) 4 MG disintegrating tablet Take 1 tablet (4 mg total) by mouth every 8 (eight) hours as needed for nausea or vomiting. 20 tablet Mickie Bail, NP      PDMP not reviewed  this encounter.   Mickie Bail, NP 04/02/23 [phone]

## 2024-03-10 ENCOUNTER — Encounter: Payer: Self-pay | Admitting: Emergency Medicine

## 2024-03-10 ENCOUNTER — Ambulatory Visit: Admission: EM | Admit: 2024-03-10 | Discharge: 2024-03-10 | Disposition: A | Discharge status: Home / Self Care

## 2024-03-10 DIAGNOSIS — J0110 Acute frontal sinusitis, unspecified: Secondary | ICD-10-CM | POA: Diagnosis not present

## 2024-03-10 MED ORDER — PROMETHAZINE-DM 6.25-15 MG/5ML PO SYRP: 5.0000 mL | ORAL_SOLUTION | Freq: Every evening | ORAL | 0 refills | Status: AC | PRN

## 2024-03-10 MED ORDER — PREDNISONE 10 MG (21) PO TBPK: ORAL_TABLET | Freq: Every day | ORAL | 0 refills | Status: AC

## 2024-03-10 MED ORDER — AMOXICILLIN-POT CLAVULANATE 875-125 MG PO TABS: 1.0000 | ORAL_TABLET | Freq: Two times a day (BID) | ORAL | 0 refills | Status: AC

## 2024-03-10 MED ORDER — BENZONATATE 100 MG PO CAPS: 100.0000 mg | ORAL_CAPSULE | Freq: Three times a day (TID) | ORAL | 0 refills | Status: AC

## 2024-03-10 NOTE — ED Triage Notes (Signed)
 Patient reports fever, cough with green mucus, fatigue, bodyaches, headache and chest congestion x 1 week. Patient has been taking Tylenol and Mucinex with mild relief. Rates bodyaches 4/10 and headache 3/10.

## 2024-03-10 NOTE — ED Provider Notes (Signed)
 " CAY RALPH PELT    CSN: [id number] Arrival date & time: 03/10/24  0831      History   Chief Complaint Chief Complaint  Patient presents with   Headache   Generalized Body Aches   Nasal Congestion   Cough   Fatigue   Fever    HPI Jaime Lewis is a 34 y.o. male.   Patient presents for evaluation of fever.  At 104, nasal congestion, sinus pain and pressure to the forehead and the cheeks worse to the left temple, sore throat from coughing, a nonproductive cough, shortness of breath with exertion a rattling to the chest and a constant headache for 7 days.  Fever had resolved but began again 1 day ago.  Congestion and shortness of breath becoming more prominent over the past 24 hours.  Cough initially was productive but has now become dry.  Denies respiratory history, non-smoker.  No known sick contacts in household, believed to have influenza but completed no testing.  Has attempted use of DayQuil NyQuil and Motrin which were ineffective therefore discontinued medication, more recently has been attempting Tylenol and Mucinex which has been more effective.       History reviewed. No pertinent past medical history.  There are no active problems to display for this patient.   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 Authorizing Provider  amoxicillin -clavulanate (AUGMENTIN ) 875-125 MG tablet Take 1 tablet by mouth every 12 (twelve) hours. Patient not taking: Reported on 04/02/2023 03/19/21   Tonette Lauraine HERO, PA-C  anastrozole (ARIMIDEX) 1 MG tablet Take 1 mg by mouth daily.    [provider]  atorvastatin (LIPITOR) 20 MG tablet Take 20 mg by mouth daily.    [provider]  chlorpheniramine-HYDROcodone (TUSSIONEX PENNKINETIC ER) 10-8  MG/5ML SUER Take 5 mLs by mouth 2 (two) times daily. Patient not taking: Reported on 04/02/2023 01/30/15   Trudy Dorn BRAVO, MD  CLOMID 50 MG tablet Take 50 mg by mouth daily.    [provider]  FLUoxetine (PROZAC) 10 MG capsule Take 10 mg by mouth daily.    [provider]  fluticasone  (FLONASE ) 50 MCG/ACT nasal spray Place 2 sprays into both nostrils daily. 03/19/21   Tonette Lauraine HERO, PA-C  hydrOXYzine (ATARAX) 25 MG tablet Take 25 mg by mouth 3 (three) times daily.    [provider]  levofloxacin  (LEVAQUIN ) 750 MG tablet Take 1 tablet (750 mg total) by mouth daily. Patient not taking: Reported on 04/02/2023 01/30/15   Trudy Dorn BRAVO, MD  lisinopril (ZESTRIL) 10 MG tablet Take 10 mg by mouth 2 (two) times daily.    [provider]  metFORMIN (GLUCOPHAGE) 500 MG tablet Take 500 mg by mouth 2 (two) times daily.    [provider]  ondansetron  (ZOFRAN -ODT) 4 MG disintegrating tablet Take 1 tablet (4 mg total) by mouth every 8 (eight) hours as needed for nausea or vomiting. 04/02/23   Corlis Burnard DEL, NP  Semaglutide, 2 MG/DOSE, 8 MG/3ML SOPN Inject into the skin. 04/01/23 03/30/24  [provider]    Family History History reviewed. No pertinent family history.  Social History Social History[1]   Allergies   Sulfa antibiotics   Review of Systems Review  of Systems  Constitutional:  Positive for fever. Negative for activity change, appetite change, chills, diaphoresis, fatigue and unexpected weight change.  HENT:  Positive for congestion, sinus pressure, sinus pain and sore throat. Negative for dental problem, drooling, ear discharge, ear pain, facial swelling, hearing loss, mouth sores, nosebleeds, postnasal drip, rhinorrhea, sneezing, tinnitus, trouble swallowing and voice change.   Respiratory:  Positive for cough and shortness of breath. Negative for apnea, choking, chest tightness, wheezing and stridor.   Cardiovascular:  Negative.   Gastrointestinal: Negative.   Neurological:  Positive for headaches. Negative for dizziness, tremors, seizures, syncope, facial asymmetry, speech difficulty, weakness, light-headedness and numbness.     Physical Exam Triage Vital Signs ED Triage Vitals  Encounter Vitals Group     BP 03/10/24 1020 126/80     Girls Systolic BP Percentile --      Girls Diastolic BP Percentile --      Boys Systolic BP Percentile --      Boys Diastolic BP Percentile --      Pulse Rate 03/10/24 1020 100     Resp 03/10/24 1020 20     Temp 03/10/24 1020 98 F (36.7 C)     Temp Source 03/10/24 1020 Oral     SpO2 03/10/24 1020 94 %     Weight --      Height --      Head Circumference --      Peak Flow --      Pain Score 03/10/24 1024 4     Pain Loc --      Pain Education --      Exclude from Growth Chart --    No data found.  Updated Vital Signs BP 126/80 (BP Location: Right Arm)   Pulse 100   Temp 98 F (36.7 C) (Oral)   Resp 20   SpO2 94%   Visual Acuity Right Eye Distance:   Left Eye Distance:   Bilateral Distance:    Right Eye Near:   Left Eye Near:    Bilateral Near:     Physical Exam Constitutional:      Appearance: He is ill-appearing.  HENT:     Head: Normocephalic.     Right Ear: Tympanic membrane, ear canal and external ear normal.     Left Ear: Tympanic membrane, ear canal and external ear normal.     Nose: Congestion present.     Left Sinus: Frontal sinus tenderness present.     Mouth/Throat:     Pharynx: No oropharyngeal exudate or posterior oropharyngeal erythema.  Cardiovascular:     Rate and Rhythm: Normal rate and regular rhythm.     Pulses: Normal pulses.     Heart sounds: Normal heart sounds.  Pulmonary:     Effort: Pulmonary effort is normal.     Breath sounds: Normal breath sounds.  Lymphadenopathy:     Cervical: No cervical adenopathy.  Neurological:     Mental Status: He is alert.      UC Treatments / Results  Labs (all labs ordered  are listed, but only abnormal results are displayed) Labs Reviewed - No data to display  EKG   Radiology No results found.  Procedures Procedures (including critical care time)  Medications Ordered in UC Medications - No data to display  Initial Impression / Assessment and Plan / UC Course  I have reviewed the triage vital signs and the nursing notes.  Pertinent labs & imaging results that were available during my care of  the patient were reviewed by me and considered in my medical decision making (see chart for details).  Acute nonrecurrent frontal sinusitis  Patient is in no signs of distress nor toxic appearing.  Vital signs are stable.  Low suspicion for pneumonia, pneumothorax or bronchitis and therefore will defer imaging.  Viral testing deferred due to timeline.  Symptoms worsening over the past 24 hours, consistent with a sinusitis.  Prescribed Augmentin  prednisone Tessalon and Promethazine DM. May use additional over-the-counter medications as needed for supportive care.  May follow-up with urgent care as needed if symptoms persist or worsen.   Final Clinical Impressions(s) / UC Diagnoses   Final diagnoses:  None   Discharge Instructions   None    ED Prescriptions   None    PDMP not reviewed this encounter.     [1]  Social History Tobacco Use   Smoking status: Never   Smokeless tobacco: Never  Vaping Use   Vaping status: Never Used  Substance Use Topics   Alcohol use: Yes    Comment: occasionally   Drug use: Never     Teresa Shelba SAUNDERS, NP 03/10/24 1044  "

## 2024-03-10 NOTE — Discharge Instructions (Signed)
 Today you are being treated for sinus infection  Take Augmentin  twice daily for 7 days for treatment of bacteria affecting symptoms  You may begin prednisone every morning with food as directed to help reduce pressure within the sinuses  You may try Tessalon pill every 8 hours as needed for coughing you may use cough syrup at bedtime if needed for rest  You can take Tylenol and/or Ibuprofen as needed for fever reduction and pain relief.   For cough: honey 1/2 to 1 teaspoon (you can dilute the honey in water or another fluid).  You can also use guaifenesin and dextromethorphan for cough. You can use a humidifier for chest congestion and cough.  If you don't have a humidifier, you can sit in the bathroom with the hot shower running.      For sore throat: try warm salt water gargles, cepacol lozenges, throat spray, warm tea or water with lemon/honey, popsicles or ice, or OTC cold relief medicine for throat discomfort.   For congestion: take a daily anti-histamine like Zyrtec, Claritin, and a oral decongestant, such as pseudoephedrine.  You can also use Flonase  1-2 sprays in each nostril daily.   It is important to stay hydrated: drink plenty of fluids (water, gatorade/powerade/pedialyte, juices, or teas) to keep your throat moisturized and help further relieve irritation/discomfort.
